# Patient Record
Sex: Female | Born: 1960 | Race: White | Hispanic: No | Smoking: Never smoker
Health system: Southern US, Community
[De-identification: ages and names within clinical notes are randomized; demographics above are authoritative.]

## PROBLEM LIST (undated history)

## (undated) DIAGNOSIS — K635 Polyp of colon: Secondary | ICD-10-CM

## (undated) DIAGNOSIS — N951 Menopausal and female climacteric states: Secondary | ICD-10-CM

## (undated) DIAGNOSIS — E785 Hyperlipidemia, unspecified: Secondary | ICD-10-CM

## (undated) DIAGNOSIS — I10 Essential (primary) hypertension: Secondary | ICD-10-CM

## (undated) DIAGNOSIS — R011 Cardiac murmur, unspecified: Secondary | ICD-10-CM

## (undated) HISTORY — DX: Essential (primary) hypertension: I10

## (undated) HISTORY — DX: Cardiac murmur, unspecified: R01.1

## (undated) HISTORY — DX: Polyp of colon: K63.5

## (undated) HISTORY — DX: Menopausal and female climacteric states: N95.1

## (undated) HISTORY — DX: Hyperlipidemia, unspecified: E78.5

---

## 2009-08-02 ENCOUNTER — Ambulatory Visit: Payer: Self-pay | Admitting: Diagnostic Radiology

## 2009-08-02 ENCOUNTER — Ambulatory Visit (HOSPITAL_BASED_OUTPATIENT_CLINIC_OR_DEPARTMENT_OTHER): Admission: RE | Admit: 2009-08-02 | Discharge: 2009-08-02 | Payer: Self-pay | Admitting: Obstetrics and Gynecology

## 2010-08-06 ENCOUNTER — Other Ambulatory Visit: Payer: Self-pay | Admitting: Obstetrics & Gynecology

## 2010-08-06 ENCOUNTER — Encounter (INDEPENDENT_AMBULATORY_CARE_PROVIDER_SITE_OTHER): Payer: PRIVATE HEALTH INSURANCE | Admitting: Obstetrics & Gynecology

## 2010-08-06 DIAGNOSIS — Z1272 Encounter for screening for malignant neoplasm of vagina: Secondary | ICD-10-CM

## 2010-08-06 DIAGNOSIS — Z01419 Encounter for gynecological examination (general) (routine) without abnormal findings: Secondary | ICD-10-CM

## 2010-09-19 NOTE — Assessment & Plan Note (Signed)
NAMECINDE, EBERT NO.:  [id number]  MEDICAL RECORD NO.:  [id number]           PATIENT TYPE:  LOCATION:  [facility] [place]           FACILITY:  PHYSICIAN:  Allie Bossier, MD             DATE OF BIRTH:  DATE OF SERVICE:  08/06/2010                                 CLINIC NOTE  Ms. Runions is a 50 year old married white nulligravida who comes in here for a new patient exam.  She moved to [place] from [place] approximately 4 years ago.  She complains of night sweats that wakes her up and vaginal dryness that causes dyspareunia in spite of [place] Jelly. She would like a refill on her hormone replacement therapy that she gets compounded.  PAST MEDICAL HISTORY:  None.  PAST SURGICAL HISTORY:  None.  SOCIAL HISTORY:  She drinks alcohol on the weekends in a social fashion. Denies tobacco or drug use.  FAMILY HISTORY:  Negative for breast, GYN, and colon malignancies.  PREVIOUS SURGERIES:  None.  REVIEW OF SYSTEMS:  She works as a Quarry manager.  She has been married for 16 years.  She is sexually active.  She has no family doctor and has not had any colonoscopy ever.  All history of Pap smears are negative.  Her last Pap smear was in November 2010.  Her last mammogram was n October 2011.  MEDICATIONS:  She takes a compounded progestin, tri-est, and testosterone 150/1.5/0.2.  She dissolves the lozenges in her mouth daily.  She also takes vitamin C, vitamin D, fish oil, magnesium, selenium.  No known drug allergies.  No latex allergies.  PHYSICAL EXAMINATION:  GENERAL:  Well-nourished, well-hydrated pleasant white female. VITAL SIGNS:  Height 5 feet 7 inches, weight 140, blood pressure 127/95, pulse 71. HEENT:  Normal. HEART:  Regular rate and rhythm. LUNGS:  Clear to auscultation bilaterally. BREASTS:  Normal bilaterally. ABDOMEN:  No palpable hepatosplenomegaly. GU:  External genitalia, moderate atrophy.  No lesions.  Cervix nulliparous.   Normal discharge.  Uterus, normal size and shape, retroverted, nontender.  Adnexa, nontender, no masses.  ASSESSMENT AND PLAN: 1. Annual exam.  Checked the Pap smear.  Recommended annual mammograms     and self-breast and self-vulvar exams monthly. 2. Slightly elevated blood pressure and no family     doctor/colonoscopy/screening.  I am referring her to the family     practice physician next door and she will have her primary care     done there as necessary.     Allie Bossier, MD    MCD/MEDQ  D:  08/06/2010  T:  08/06/2010  Job:  [id number]

## 2012-03-02 ENCOUNTER — Telehealth: Payer: Self-pay | Admitting: *Deleted

## 2012-03-02 ENCOUNTER — Ambulatory Visit (INDEPENDENT_AMBULATORY_CARE_PROVIDER_SITE_OTHER): Payer: PRIVATE HEALTH INSURANCE | Admitting: Obstetrics & Gynecology

## 2012-03-02 ENCOUNTER — Encounter: Payer: Self-pay | Admitting: Obstetrics & Gynecology

## 2012-03-02 VITALS — BP 122/88 | HR 84 | Temp 98.0°F | Resp 16 | Ht 67.0 in | Wt 141.0 lb

## 2012-03-02 DIAGNOSIS — Z0000 Encounter for general adult medical examination without abnormal findings: Secondary | ICD-10-CM

## 2012-03-02 DIAGNOSIS — Z124 Encounter for screening for malignant neoplasm of cervix: Secondary | ICD-10-CM

## 2012-03-02 NOTE — Telephone Encounter (Signed)
[facility] pharmacy does not do the HRT and I sent RX to [facility]  [phone] in [place], [place]

## 2012-03-02 NOTE — Progress Notes (Signed)
RX for her compounded HRT was called into [facility].

## 2012-03-02 NOTE — Progress Notes (Signed)
Subjective:    Tanya Hernandez is a 51 y.o. female who presents for an annual exam. The patient has no complaints today. She wants to refill her compounded HRT. The patient is sexually active. GYN screening history: last pap: was normal. The patient wears seatbelts: yes. The patient participates in regular exercise: yes (Zumba). Has the patient ever been transfused or tattooed?: no. The patient reports that there is not domestic violence in her life.   Menstrual History: OB History    Grav Para Term Preterm Abortions TAB SAB Ect Mult Living   0 0 0 0 0 0 0 0 0 0       Menarche age: 11 No LMP recorded. Patient is postmenopausal.    The following portions of the patient's history were reviewed and updated as appropriate: allergies, current medications, past family history, past medical history, past social history, past surgical history and problem list.  Review of Systems A comprehensive review of systems was negative. 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, Quarry manager  Objective:    BP 122/88  Pulse 84  Temp 98 F (36.7 C) (Oral)  Resp 16  Ht 5\' 7"  (1.702 m)  Wt 141 lb (63.957 kg)  BMI 22.08 kg/m2  General Appearance:    Alert, cooperative, no distress, appears stated age  Head:    Normocephalic, without obvious abnormality, atraumatic  Eyes:    PERRL, conjunctiva/corneas clear, EOM's intact, fundi    benign, both eyes  Ears:    Normal TM's and external ear canals, both ears  Nose:   Nares normal, septum midline, mucosa normal, no drainage    or sinus tenderness  Throat:   Lips, mucosa, and tongue normal; teeth and gums normal  Neck:   Supple, symmetrical, trachea midline, no adenopathy;    thyroid:  no enlargement/tenderness/nodules; no carotid   bruit or JVD  Back:     Symmetric, no curvature, ROM normal, no CVA tenderness  Lungs:     Clear to auscultation bilaterally, respirations unlabored  Chest Wall:    No tenderness or deformity   Heart:    Regular  rate and rhythm, S1 and S2 normal, no murmur, rub   or gallop  Breast Exam:    No tenderness, masses, or nipple abnormality  Abdomen:     Soft, non-tender, bowel sounds active all four quadrants,    no masses, no organomegaly  Genitalia:    Normal female without lesion, discharge or tenderness, mild atrophy, NSSR, NT, minimal mobility, no adnexal masses or tenderness     Extremities:   Extremities normal, atraumatic, no cyanosis or edema  Pulses:   2+ and symmetric all extremities  Skin:   Skin color, texture, turgor normal, no rashes or lesions  Lymph nodes:   Cervical, supraclavicular, and axillary nodes normal  Neurologic:   CNII-XII intact, normal strength, sensation and reflexes    throughout  .    Assessment:    Healthy female exam.    Plan:     Mammogram. Pap smear.  Refill on HRT I have recommended a colonoscopy

## 2012-03-03 ENCOUNTER — Ambulatory Visit (HOSPITAL_BASED_OUTPATIENT_CLINIC_OR_DEPARTMENT_OTHER)
Admission: RE | Admit: 2012-03-03 | Discharge: 2012-03-03 | Disposition: A | Discharge status: Home / Self Care | Payer: PRIVATE HEALTH INSURANCE | Source: Ambulatory Visit | Attending: Obstetrics & Gynecology | Admitting: Obstetrics & Gynecology

## 2012-03-03 DIAGNOSIS — Z0000 Encounter for general adult medical examination without abnormal findings: Secondary | ICD-10-CM

## 2012-03-03 DIAGNOSIS — Z1231 Encounter for screening mammogram for malignant neoplasm of breast: Secondary | ICD-10-CM | POA: Insufficient documentation

## 2012-04-20 ENCOUNTER — Telehealth: Payer: Self-pay | Admitting: *Deleted

## 2012-04-20 DIAGNOSIS — Z79890 Hormone replacement therapy (postmenopausal): Secondary | ICD-10-CM

## 2012-04-20 MED ORDER — AMBULATORY NON FORMULARY MEDICATION: 1.0000 | Freq: Every day | Status: DC

## 2012-04-20 NOTE — Telephone Encounter (Signed)
Pt called wanted her HRT switched to [employer] as she did not like Environmental education officer.

## 2013-10-18 ENCOUNTER — Encounter: Payer: Self-pay | Admitting: Obstetrics & Gynecology

## 2013-10-18 ENCOUNTER — Ambulatory Visit (INDEPENDENT_AMBULATORY_CARE_PROVIDER_SITE_OTHER): Payer: PRIVATE HEALTH INSURANCE | Admitting: Obstetrics & Gynecology

## 2013-10-18 VITALS — BP 155/96 | HR 63 | Resp 16 | Ht 67.0 in | Wt 138.0 lb

## 2013-10-18 DIAGNOSIS — Z1151 Encounter for screening for human papillomavirus (HPV): Secondary | ICD-10-CM

## 2013-10-18 DIAGNOSIS — Z0000 Encounter for general adult medical examination without abnormal findings: Secondary | ICD-10-CM

## 2013-10-18 DIAGNOSIS — Z124 Encounter for screening for malignant neoplasm of cervix: Secondary | ICD-10-CM

## 2013-10-18 DIAGNOSIS — Z01419 Encounter for gynecological examination (general) (routine) without abnormal findings: Secondary | ICD-10-CM

## 2013-10-18 NOTE — Progress Notes (Signed)
Subjective:    Tanya Hernandez is a 53 y.o. female who presents for an annual exam. The patient has no complaints today. She wants a refill of her HRT (est/test/prog) The patient is sexually active. GYN screening history: last pap: was normal. The patient wears seatbelts: yes. The patient participates in regular exercise: yes.( zumba) Has the patient ever been transfused or tattooed?: no. The patient reports that there is not domestic violence in her life.   Menstrual History: OB History   Grav Para Term Preterm Abortions TAB SAB Ect Mult Living   0 0 0 0 0 0 0 0 0 0       Menarche age: 61  No LMP recorded. Patient is postmenopausal.    The following portions of the patient's history were reviewed and updated as appropriate: allergies, current medications, past family history, past medical history, past social history, past surgical history and problem list.  Review of Systems A comprehensive review of systems was negative. Her FP does her screening labs. She needs a mammo. Colonoscopy is due. Married for 19 years, some dryness with sex   Objective:    BP 155/96  Pulse 63  Resp 16  Ht 5\' 7"  (1.702 m)  Wt 138 lb (62.596 kg)  BMI 21.61 kg/m2  General Appearance:    Alert, cooperative, no distress, appears stated age  Head:    Normocephalic, without obvious abnormality, atraumatic  Eyes:    PERRL, conjunctiva/corneas clear, EOM's intact, fundi    benign, both eyes  Ears:    Normal TM's and external ear canals, both ears  Nose:   Nares normal, septum midline, mucosa normal, no drainage    or sinus tenderness  Throat:   Lips, mucosa, and tongue normal; teeth and gums normal  Neck:   Supple, symmetrical, trachea midline, no adenopathy;    thyroid:  no enlargement/tenderness/nodules; no carotid   bruit or JVD  Back:     Symmetric, no curvature, ROM normal, no CVA tenderness  Lungs:     Clear to auscultation bilaterally, respirations unlabored  Chest Wall:    No tenderness or deformity   Heart:    Regular rate and rhythm, S1 and S2 normal, no murmur, rub   or gallop  Breast Exam:    No tenderness, masses, or nipple abnormality  Abdomen:     Soft, non-tender, bowel sounds active all four quadrants,    no masses, no organomegaly  Genitalia:    Normal female without lesion, discharge or tenderness, NSSR, NT, mobile, normal adnexal exam     Extremities:   Extremities normal, atraumatic, no cyanosis or edema  Pulses:   2+ and symmetric all extremities  Skin:   Skin color, texture, turgor normal, no rashes or lesions  Lymph nodes:   Cervical, supraclavicular, and axillary nodes normal  Neurologic:   CNII-XII intact, normal strength, sensation and reflexes    throughout  .    Assessment:    Healthy female exam.    Plan:     Breast self exam technique reviewed and patient encouraged to perform self-exam monthly. Mammogram. Thin prep Pap smear. with cotesting Colonoscopy recommended I refilled her compounded HRT

## 2013-11-21 ENCOUNTER — Telehealth: Payer: Self-pay | Admitting: *Deleted

## 2013-11-21 NOTE — Telephone Encounter (Signed)
LM on voicemail of no cancerous cells noted on pap smear done 10/18/13.

## 2013-11-27 ENCOUNTER — Other Ambulatory Visit: Payer: Self-pay | Admitting: Obstetrics & Gynecology

## 2013-11-27 DIAGNOSIS — Z1231 Encounter for screening mammogram for malignant neoplasm of breast: Secondary | ICD-10-CM

## 2013-12-06 ENCOUNTER — Ambulatory Visit (HOSPITAL_BASED_OUTPATIENT_CLINIC_OR_DEPARTMENT_OTHER)
Admission: RE | Admit: 2013-12-06 | Discharge: 2013-12-06 | Disposition: A | Discharge status: Home / Self Care | Payer: PRIVATE HEALTH INSURANCE | Source: Ambulatory Visit | Attending: Obstetrics & Gynecology | Admitting: Obstetrics & Gynecology

## 2013-12-06 DIAGNOSIS — Z1231 Encounter for screening mammogram for malignant neoplasm of breast: Secondary | ICD-10-CM | POA: Insufficient documentation

## 2014-05-21 ENCOUNTER — Other Ambulatory Visit: Payer: Self-pay | Admitting: *Deleted

## 2014-05-21 DIAGNOSIS — Z79890 Hormone replacement therapy (postmenopausal): Secondary | ICD-10-CM

## 2014-05-21 MED ORDER — AMBULATORY NON FORMULARY MEDICATION: 1.0000 | Freq: Every day | Status: AC

## 2014-05-21 NOTE — Telephone Encounter (Signed)
RF authorization for estrogen/progest lonzenges sent to [facility].

## 2016-11-03 DIAGNOSIS — N951 Menopausal and female climacteric states: Secondary | ICD-10-CM | POA: Diagnosis not present

## 2016-11-03 DIAGNOSIS — Z0000 Encounter for general adult medical examination without abnormal findings: Secondary | ICD-10-CM | POA: Diagnosis not present

## 2016-11-17 ENCOUNTER — Other Ambulatory Visit (HOSPITAL_BASED_OUTPATIENT_CLINIC_OR_DEPARTMENT_OTHER): Payer: Self-pay | Admitting: Internal Medicine

## 2016-11-17 DIAGNOSIS — Z1231 Encounter for screening mammogram for malignant neoplasm of breast: Secondary | ICD-10-CM

## 2016-11-19 ENCOUNTER — Ambulatory Visit (HOSPITAL_BASED_OUTPATIENT_CLINIC_OR_DEPARTMENT_OTHER)
Admission: RE | Admit: 2016-11-19 | Discharge: 2016-11-19 | Disposition: A | Discharge status: Home / Self Care | Payer: Commercial Managed Care - PPO | Source: Ambulatory Visit | Attending: Internal Medicine | Admitting: Internal Medicine

## 2016-11-19 DIAGNOSIS — Z1231 Encounter for screening mammogram for malignant neoplasm of breast: Secondary | ICD-10-CM | POA: Insufficient documentation

## 2017-02-12 DIAGNOSIS — Z1211 Encounter for screening for malignant neoplasm of colon: Secondary | ICD-10-CM | POA: Diagnosis not present

## 2017-02-12 DIAGNOSIS — D126 Benign neoplasm of colon, unspecified: Secondary | ICD-10-CM | POA: Diagnosis not present

## 2017-11-09 DIAGNOSIS — M79646 Pain in unspecified finger(s): Secondary | ICD-10-CM | POA: Diagnosis not present

## 2017-11-09 DIAGNOSIS — E785 Hyperlipidemia, unspecified: Secondary | ICD-10-CM | POA: Diagnosis not present

## 2017-11-09 DIAGNOSIS — Z0000 Encounter for general adult medical examination without abnormal findings: Secondary | ICD-10-CM | POA: Diagnosis not present

## 2017-11-09 DIAGNOSIS — I10 Essential (primary) hypertension: Secondary | ICD-10-CM | POA: Diagnosis not present

## 2020-09-25 ENCOUNTER — Other Ambulatory Visit (HOSPITAL_BASED_OUTPATIENT_CLINIC_OR_DEPARTMENT_OTHER): Payer: Self-pay | Admitting: Physician Assistant

## 2020-09-25 DIAGNOSIS — Z1231 Encounter for screening mammogram for malignant neoplasm of breast: Secondary | ICD-10-CM

## 2020-09-27 ENCOUNTER — Ambulatory Visit (HOSPITAL_BASED_OUTPATIENT_CLINIC_OR_DEPARTMENT_OTHER)
Admission: RE | Admit: 2020-09-27 | Discharge: 2020-09-27 | Disposition: A | Discharge status: Home / Self Care | Payer: Managed Care, Other (non HMO) | Source: Ambulatory Visit | Attending: Physician Assistant | Admitting: Physician Assistant

## 2020-09-27 ENCOUNTER — Other Ambulatory Visit: Payer: Self-pay

## 2020-09-27 DIAGNOSIS — Z1231 Encounter for screening mammogram for malignant neoplasm of breast: Secondary | ICD-10-CM

## 2022-02-19 ENCOUNTER — Other Ambulatory Visit: Payer: Self-pay | Admitting: Family Medicine

## 2022-02-19 ENCOUNTER — Ambulatory Visit
Admission: RE | Admit: 2022-02-19 | Discharge: 2022-02-19 | Disposition: A | Discharge status: Home / Self Care | Payer: Commercial Managed Care - HMO | Source: Ambulatory Visit | Attending: Family Medicine | Admitting: Family Medicine

## 2022-02-19 DIAGNOSIS — M20011 Mallet finger of right finger(s): Secondary | ICD-10-CM

## 2022-02-20 ENCOUNTER — Other Ambulatory Visit: Payer: Self-pay | Admitting: Family Medicine

## 2022-02-20 DIAGNOSIS — Z780 Asymptomatic menopausal state: Secondary | ICD-10-CM

## 2022-02-20 DIAGNOSIS — G8929 Other chronic pain: Secondary | ICD-10-CM

## 2022-02-24 ENCOUNTER — Encounter: Payer: Self-pay | Admitting: Rehabilitative and Restorative Service Providers"

## 2022-02-24 ENCOUNTER — Ambulatory Visit: Payer: PRIVATE HEALTH INSURANCE | Admitting: Orthopaedic Surgery

## 2022-02-24 ENCOUNTER — Other Ambulatory Visit: Payer: Self-pay

## 2022-02-24 ENCOUNTER — Ambulatory Visit: Payer: Commercial Managed Care - HMO | Admitting: Rehabilitative and Restorative Service Providers"

## 2022-02-24 DIAGNOSIS — M25541 Pain in joints of right hand: Secondary | ICD-10-CM

## 2022-02-24 DIAGNOSIS — R600 Localized edema: Secondary | ICD-10-CM

## 2022-02-24 DIAGNOSIS — M6281 Muscle weakness (generalized): Secondary | ICD-10-CM | POA: Diagnosis not present

## 2022-02-24 DIAGNOSIS — R278 Other lack of coordination: Secondary | ICD-10-CM | POA: Diagnosis not present

## 2022-02-24 DIAGNOSIS — M25641 Stiffness of right hand, not elsewhere classified: Secondary | ICD-10-CM

## 2022-02-24 NOTE — Therapy (Signed)
OUTPATIENT OCCUPATIONAL THERAPY ORTHO EVALUATION  Patient Name: Tanya Hernandez MRN: [id number] DOB:06/08/1961, 61 y.o., female Today's Date: 02/24/2022  PCP: Corine Shelter, PA-C REFERRING PROVIDER: Eunice Blase, MD   OT End of Session - 02/24/22 1114     Visit Number 1    Number of Visits 6    Date for OT Re-Evaluation 05/08/22    OT Start Time 1114    OT Stop Time 1224    OT Time Calculation (min) 70 min    Equipment Utilized During Treatment orthotic materials    Activity Tolerance No increased pain;Patient tolerated treatment well;Patient limited by pain    Behavior During Therapy [facility] for tasks assessed/performed             Past Medical History:  Diagnosis Date   Menopausal state    History reviewed. No pertinent surgical history. There are no problems to display for this patient.   ONSET DATE: 02/19/22 DOI Approx   REFERRING DIAG: Right 5th finger with boney mallet injury and slight displacement  THERAPY DIAG:  Pain in joint of right hand - Plan: Ot plan of care cert/re-cert  Other lack of coordination - Plan: Ot plan of care cert/re-cert  Localized edema - Plan: Ot plan of care cert/re-cert  Muscle weakness (generalized) - Plan: Ot plan of care cert/re-cert  Stiffness of right hand, not elsewhere classified - Plan: Ot plan of care cert/re-cert  Rationale for Evaluation and Treatment Rehabilitation  SUBJECTIVE:   SUBJECTIVE STATEMENT: She states she fell while walking her dog, injured the tip of her right hand small finger. She states having pain, stiffness, and not understanding precautions. She types a lot and needs full use of her hands, right now is not safe to do all I/ADLs. She arrives wearing a piece of tape around her small finger. She shows OT the small splint the doctor gave her, but she states taking it off last night in favor of the tape. OT educates her to not allow tip of finger to bend for at least 6 weeks.   PERTINENT HISTORY: starting  immobilization protocol week 1 now (02/24/22)   WEIGHT BEARING RESTRICTIONS Yes avoid a strong grip or force through right hand for next 4-6 weeks.   PAIN:  Are you having pain? Yes Rating: 1-2/10 mildly sore in right SF DIP J now  FALLS: Has patient fallen in last 6 months? Yes. Number of falls 1 (this injury)  LIVING ENVIRONMENT: Lives with: lives with their spouse   PLOF: Independent  PATIENT GOALS To get full use of right dominant hand back, safely.   OBJECTIVE:   HAND DOMINANCE: Right   ADLs: Overall ADLs: States decreased ability to grab, hold household objects, pain and inability to open containers, perform all FMS tasks, etc.    FUNCTIONAL OUTCOME MEASURES: Eval: Patient Specific Functional Scale: 8 (lifting gallon of milk, typing, self-care exercise routines)  UPPER EXTREMITY ROM    Eval: right SF passively flexible to full extension or slight (~5*) hyperextension today, but sags/drops passively from ligament/bony injury.  Strength and motion not appropriate for this hand/finger yet.   Interesting to note that PIP J is somewhat stiff in extension and passively tender past ~60* flexion. Wrist and proximal seem within normal limits.     Active ROM Right TBD  Little MCP (0-90)    Little PIP (0-100)    Little DIP (0-70)    (Blank rows = not tested)   UPPER EXTREMITY MMT:    Eval: not appropriate  for right hand now, but overtly weaker due to injury, pain  HAND FUNCTION: Eval: Grip strength Right: TBD lbs  COORDINATION: Eval: mild difficulties with FMS due to SF injury now  SENSATION: Eval:  Light touch intact today  EDEMA:   Eval:  Mildly swollen in small finger DIP today  OBSERVATIONS:   Eval: Apparent mallet injury to right SF. Slightly red and swollen, tender.    TODAY'S TREATMENT:  Eval:  Custom orthotic fabrication was indicated due to pt's healing bone and tendon injury to right hand and need for safe, functional positioning. OT fabricated three  custom mallet orthotics for the purpose of having stabilized DIP J in full extension while healing & to have the ability to change out the orthotic if one becomes wet from shower, needs cleaned, etc. They fit well with no areas of pressure, pt states a fairly comfortable fit. Pt was educated on putting on and off orthotics carefully with fingertip against table top to prevent any flexing of DIP joint. She did several trials of this for functional safety training. She was also edu on wearing at all times, not allowing tip of finger to bend for at least 6 weeks straight.  She states understanding.   She should call or come in ASAP if it is causing any irritation or is not achieving desired function. It will be checked/adjusted in upcoming sessions, as needed. Pt states understanding.   OT also suggests doing light blocks AROM for stiff PIP J as well as open/close hand 3-4 times a day 10-15 x to light tension (no pain), and to continue using hand for all light daily activities as tolerated.     PATIENT EDUCATION: Education details: See tx section above for details  Person educated: Patient Education method: Verbal Instruction, Teach back, Handouts  Education comprehension: States and demonstrates understanding, Additional Education required    HOME EXERCISE PROGRAM: See tx section above for details   GOALS: Goals reviewed with patient? Yes   SHORT TERM GOALS: (STG required if POC>30 days)  Pt will obtain protective, custom orthotic. Target date: 02/24/22 Goal status: MET  2.  Pt will demo/state understanding of initial HEP to improve pain levels and prerequisite motion. Target date: 02/24/22 Goal status: MET   LONG TERM GOALS:  Pt will improve functional ability by decreased impairment per PSFS assessment from 8 to 9.5 or better, for better quality of life. Target date: 05/08/22 Goal status: INITIAL  2.  Pt will improve grip strength in right hand to at least 40 lbs for functional  use at home and in IADLs. Target date: 05/08/22 Goal status: INITIAL  3.  Pt will improve A/ROM in right SF TAM to at least 220*, to have functional motion for tasks like reach and grasp.  Target date: 05/08/22 Goal status: INITIAL  4.  Pt will improve strength in right arm to at least 5/5 MMT to have increased functional ability to carry out selfcare and higher-level homecare tasks with no difficulty. Target date: 05/08/22 Goal status: INITIAL   ASSESSMENT:  CLINICAL IMPRESSION: Patient is a 61 y.o. female who was seen today for occupational therapy evaluation for right hand mallet finger injury, subsequent difficulties with pain, motion and ADLs. She will benefit from OP OT.   PERFORMANCE DEFICITS in functional skills including IADLs, coordination, dexterity, edema, ROM, strength, pain, flexibility, body mechanics, endurance, and UE functional use, cognitive skills including safety awareness, and psychosocial skills including coping strategies and routines and behaviors.   IMPAIRMENTS are  limiting patient from IADLs, work, and leisure.   COMORBIDITIES may have co-morbidities  that affects occupational performance. Patient will benefit from skilled OT to address above impairments and improve overall function.  MODIFICATION OR ASSISTANCE TO COMPLETE EVALUATION: No modification of tasks or assist necessary to complete an evaluation.  OT OCCUPATIONAL PROFILE AND HISTORY: Problem focused assessment: Including review of records relating to presenting problem.  CLINICAL DECISION MAKING: LOW - limited treatment options, no task modification necessary  REHAB POTENTIAL: Excellent  EVALUATION COMPLEXITY: Low    PLAN: OT FREQUENCY:  During initial 6 weeks, she may be seen for f/u to adjust orthotics or address issues as needed, otherwise, she can return after immobilization 1-2 visits a week for 4 additional weeks as needed to work on hand motion and strength safely.   OT DURATION: 10  weeks (total time, through 05/08/22 as needed)   PLANNED INTERVENTIONS: self care/ADL training, therapeutic exercise, therapeutic activity, manual therapy, passive range of motion, splinting, ultrasound, fluidotherapy, moist heat, cryotherapy, contrast bath, patient/family education, coping strategies training, and Re-evaluation  RECOMMENDED OTHER SERVICES: none now   CONSULTED AND AGREED WITH PLAN OF CARE: Patient  PLAN FOR NEXT SESSION: check orthotics as needed in next 6 weeks, otherwise she can return for ROM and strength rehab, as needed, once considered well healed by MD.   Benito Mccreedy, OTR/L, CHT 02/24/2022, 4:43 PM

## 2022-03-24 ENCOUNTER — Other Ambulatory Visit: Payer: Self-pay | Admitting: Family Medicine

## 2022-03-24 ENCOUNTER — Ambulatory Visit
Admission: RE | Admit: 2022-03-24 | Discharge: 2022-03-24 | Disposition: A | Discharge status: Home / Self Care | Payer: No Typology Code available for payment source | Source: Ambulatory Visit | Attending: Family Medicine | Admitting: Family Medicine

## 2022-03-24 DIAGNOSIS — M20011 Mallet finger of right finger(s): Secondary | ICD-10-CM

## 2022-04-02 ENCOUNTER — Other Ambulatory Visit: Payer: PRIVATE HEALTH INSURANCE

## 2022-04-13 ENCOUNTER — Encounter: Payer: Self-pay | Admitting: Rehabilitative and Restorative Service Providers"

## 2022-04-13 ENCOUNTER — Ambulatory Visit: Payer: PRIVATE HEALTH INSURANCE | Admitting: Rehabilitative and Restorative Service Providers"

## 2022-04-13 DIAGNOSIS — R600 Localized edema: Secondary | ICD-10-CM | POA: Diagnosis not present

## 2022-04-13 DIAGNOSIS — M6281 Muscle weakness (generalized): Secondary | ICD-10-CM | POA: Diagnosis not present

## 2022-04-13 DIAGNOSIS — M25641 Stiffness of right hand, not elsewhere classified: Secondary | ICD-10-CM

## 2022-04-13 DIAGNOSIS — R278 Other lack of coordination: Secondary | ICD-10-CM | POA: Diagnosis not present

## 2022-04-13 DIAGNOSIS — M25541 Pain in joints of right hand: Secondary | ICD-10-CM

## 2022-04-13 NOTE — Therapy (Addendum)
OUTPATIENT OCCUPATIONAL THERAPY DISCHARGE NOTE  Patient Name: Tanya Hernandez MRN: [id number] DOB:08/22/1960, 61 y.o., female Today's Date: 04/13/2022  PCP: Corine Shelter, PA-C REFERRING PROVIDER: Eunice Blase, MD   OT End of Session - 04/13/22 0846     Visit Number 2    Number of Visits 6    Date for OT Re-Evaluation 05/08/22    OT Start Time 0846    OT Stop Time 0923    OT Time Calculation (min) 37 min    Equipment Utilized During Treatment orthotic materials    Activity Tolerance No increased pain;Patient tolerated treatment well;Patient limited by pain    Behavior During Therapy [facility] for tasks assessed/performed              Past Medical History:  Diagnosis Date   Menopausal state    History reviewed. No pertinent surgical history. There are no problems to display for this patient.   ONSET DATE: 02/19/22 DOI Approx   REFERRING DIAG: Right 5th finger with boney mallet injury and slight displacement  THERAPY DIAG:  Other lack of coordination  Localized edema  Muscle weakness (generalized)  Stiffness of right hand, not elsewhere classified  Pain in joint of right hand  Rationale for Evaluation and Treatment Rehabilitation  PERTINENT HISTORY: She is now ~7 weeks into immobilization period after Rt SF bony mallet finger injury. She started immobilization 02/24/22  WEIGHT BEARING RESTRICTIONS Yes, slowly work through AROM, blocking, and into light gripping over the next 3-5 weeks.    SUBJECTIVE:   SUBJECTIVE STATEMENT: She returns after jut under 6 weeks from initial eval and orthotic fabrication. She states that her recent MD f/u and imaging revealed slow to heal bony mallet injury and fragment still somewhat displaced. She has no pain now, feels stiff. She came with new order which will be ignored, as she is still currently under a plan of care for this. She states she has been careful to not bend DIP joint, and orthotics have felt a little tight on her due to  swelling changes. She states she don't know when her next MD f/u is or how much longer he'd like her immobilized.   PAIN:  Are you having pain? No Rating: 0/10  in right SF DIP J now   OBJECTIVE: (All objective assessments below are from initial evaluation on: 02/24/22 unless otherwise specified.)    HAND DOMINANCE: Right   ADLs: Overall ADLs: States decreased ability to grab, hold household objects, pain and inability to open containers, perform all FMS tasks, etc.    FUNCTIONAL OUTCOME MEASURES: Eval: Patient Specific Functional Scale: 8 (lifting gallon of milk, typing, self-care exercise routines)  UPPER EXTREMITY ROM    Eval: right SF passively flexible to full extension or slight (~5*) hyperextension today, but sags/drops passively from ligament/bony injury.  Strength and motion not appropriate for this hand/finger yet.   Interesting to note that PIP J is somewhat stiff in extension and passively tender past ~60* flexion. Wrist and proximal seem within normal limits.     Active ROM Right TBD  Little MCP (0-90)    Little PIP (0-100)    Little DIP (0-70)   (Blank rows = not tested)   UPPER EXTREMITY MMT:    Eval: not appropriate for right hand now, but overtly weaker due to injury, pain  HAND FUNCTION: Eval: Grip strength Right: TBD lbs  COORDINATION: Eval: mild difficulties with FMS due to SF injury now   OBSERVATIONS:   04/13/22: She seems able to  maintain DIP J extension with AROM now, but is a bit drooping still (7-10* lag). Not as red, but still somewhat swollen today, still mildly tender about DIP J.     TODAY'S TREATMENT:  04/13/22:  OT has her remove orthotic and she is able to keep SF DIP J mostly straight actively (approx (-7*) lag). This used to be much greater ~60-70* bend, per pt. She has no significant pain with attempted AROM and just appears stiff, however due to MD's new order and lack of full healing on x-ray imaging, we will keep her immobilized in  new, better fitting orthotics (2 new made today to accommodate changes in swelling and to have better force in extension) for an additional 2-4 weeks, likely. (This will depend on healing, etc.)  She doesn't currently have a f/u scheduled with her MD, so OT called his office and left a message with the  to ask about the expectation for f/u and additional immobilization time.  OT will await a response and let the pt know.  She can continue to self-manage with orthotic immobilization until otherwise informed.    PATIENT EDUCATION: Education details: See tx section above for details  Person educated: Patient Education method: Verbal Instruction, Teach back, Handouts  Education comprehension: States and demonstrates understanding, Additional Education required    HOME EXERCISE PROGRAM: See tx section above for details   GOALS: Goals reviewed with patient? Yes   SHORT TERM GOALS: (STG required if POC>30 days)  Pt will obtain protective, custom orthotic. Target date: 02/24/22 Goal status: MET  2.  Pt will demo/state understanding of initial HEP to improve pain levels and prerequisite motion. Target date: 02/24/22 Goal status: MET   LONG TERM GOALS:  Pt will improve functional ability by decreased impairment per PSFS assessment from 8 to 9.5 or better, for better quality of life. Target date: 05/08/22 Goal status: INITIAL  2.  Pt will improve grip strength in right hand to at least 40 lbs for functional use at home and in IADLs. Target date: 05/08/22 Goal status: INITIAL  3.  Pt will improve A/ROM in right SF TAM to at least 220*, to have functional motion for tasks like reach and grasp.  Target date: 05/08/22 Goal status: INITIAL  4.  Pt will improve strength in right arm to at least 5/5 MMT to have increased functional ability to carry out selfcare and higher-level homecare tasks with no difficulty. Target date: 05/08/22 Goal status: INITIAL   ASSESSMENT:  CLINICAL  IMPRESSION: 04/13/22: She will likely soon be able to start repetitive HEP, but OT will use caution and wait to hear from her MD.    PLAN: OT FREQUENCY:  During initial 6 weeks, she may be seen for f/u to adjust orthotics or address issues as needed, otherwise, she can return after immobilization 1-2 visits a week for 4 additional weeks as needed to work on hand motion and strength safely.   OT DURATION: 10 weeks (total time, through 05/08/22 as needed)   PLANNED INTERVENTIONS: self care/ADL training, therapeutic exercise, therapeutic activity, manual therapy, passive range of motion, splinting, ultrasound, fluidotherapy, moist heat, cryotherapy, contrast bath, patient/family education, coping strategies training, and Re-evaluation  RECOMMENDED OTHER SERVICES: none now   CONSULTED AND AGREED WITH PLAN OF CARE: Patient  PLAN FOR NEXT SESSION:  Check new HEP, start formal HEP.   Benito Mccreedy, OTR/L, CHT 04/13/2022, 3:09 PM   OCCUPATIONAL THERAPY DISCHARGE SUMMARY  She did not return to therapy or contact us for  months, so OT will discharge her now per policy.  Please see notes for details, goals could not be addressed.   Benito Mccreedy, OTR/L, CHT 06/10/22

## 2022-04-21 ENCOUNTER — Ambulatory Visit
Admission: RE | Admit: 2022-04-21 | Discharge: 2022-04-21 | Disposition: A | Discharge status: Home / Self Care | Payer: Commercial Managed Care - HMO | Source: Ambulatory Visit | Attending: Family Medicine | Admitting: Family Medicine

## 2022-04-21 ENCOUNTER — Other Ambulatory Visit: Payer: Self-pay | Admitting: Family Medicine

## 2022-04-21 DIAGNOSIS — T1490XA Injury, unspecified, initial encounter: Secondary | ICD-10-CM

## 2022-06-18 ENCOUNTER — Other Ambulatory Visit (HOSPITAL_BASED_OUTPATIENT_CLINIC_OR_DEPARTMENT_OTHER): Payer: Self-pay | Admitting: Family Medicine

## 2022-06-18 DIAGNOSIS — Z1231 Encounter for screening mammogram for malignant neoplasm of breast: Secondary | ICD-10-CM

## 2022-07-06 ENCOUNTER — Encounter (HOSPITAL_BASED_OUTPATIENT_CLINIC_OR_DEPARTMENT_OTHER): Payer: Self-pay

## 2022-07-06 ENCOUNTER — Ambulatory Visit (HOSPITAL_BASED_OUTPATIENT_CLINIC_OR_DEPARTMENT_OTHER)
Admission: RE | Admit: 2022-07-06 | Discharge: 2022-07-06 | Disposition: A | Discharge status: Home / Self Care | Payer: Commercial Managed Care - HMO | Source: Ambulatory Visit | Attending: Family Medicine | Admitting: Family Medicine

## 2022-07-06 DIAGNOSIS — Z1231 Encounter for screening mammogram for malignant neoplasm of breast: Secondary | ICD-10-CM | POA: Insufficient documentation

## 2022-07-10 IMAGING — MG MM DIGITAL SCREENING BILAT W/ TOMO AND CAD
8 series · 9 of 24 positions shown · non-contrast
Comparison: Previous exam(s).

CLINICAL DATA: Screening.

EXAM:
DIGITAL SCREENING BILATERAL MAMMOGRAM WITH TOMOSYNTHESIS AND CAD
TECHNIQUE: Bilateral screening digital craniocaudal and mediolateral oblique
mammograms were obtained. Bilateral screening digital breast
tomosynthesis was performed. The images were evaluated with
computer-aided detection.

[R CC synth-2D]
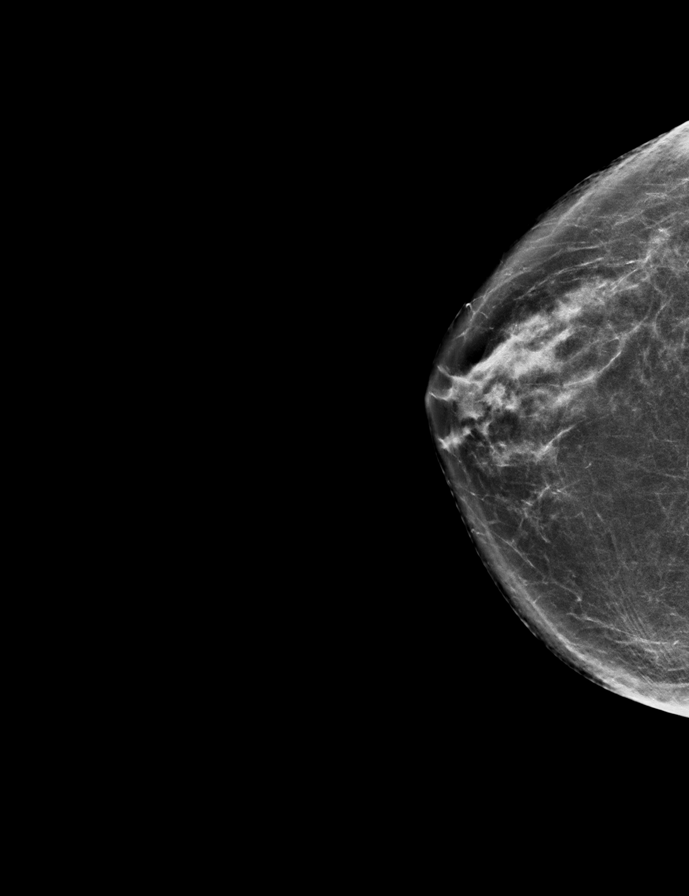

[R MLO synth-2D]
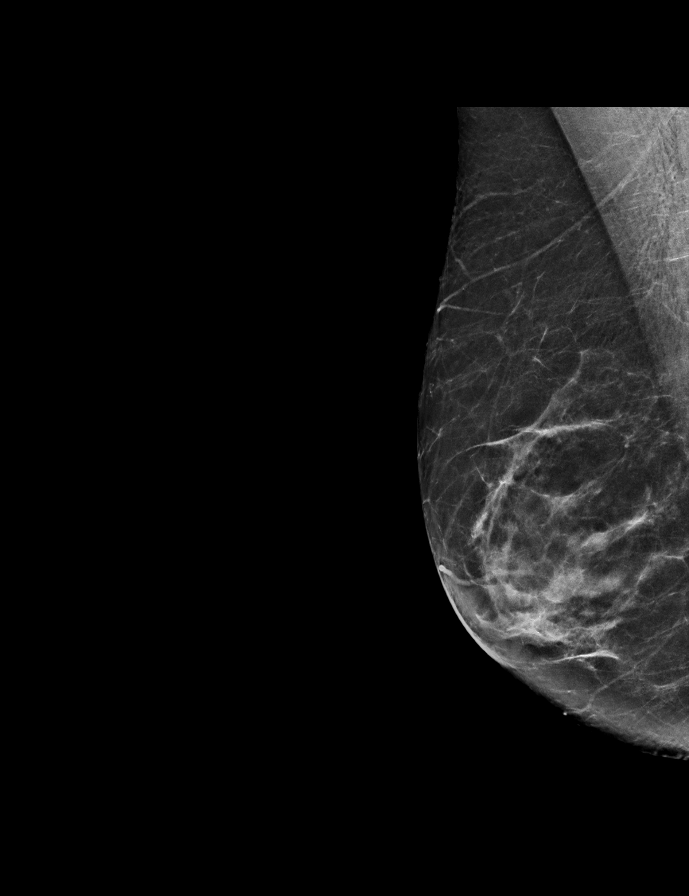

[L MLO synth-2D]
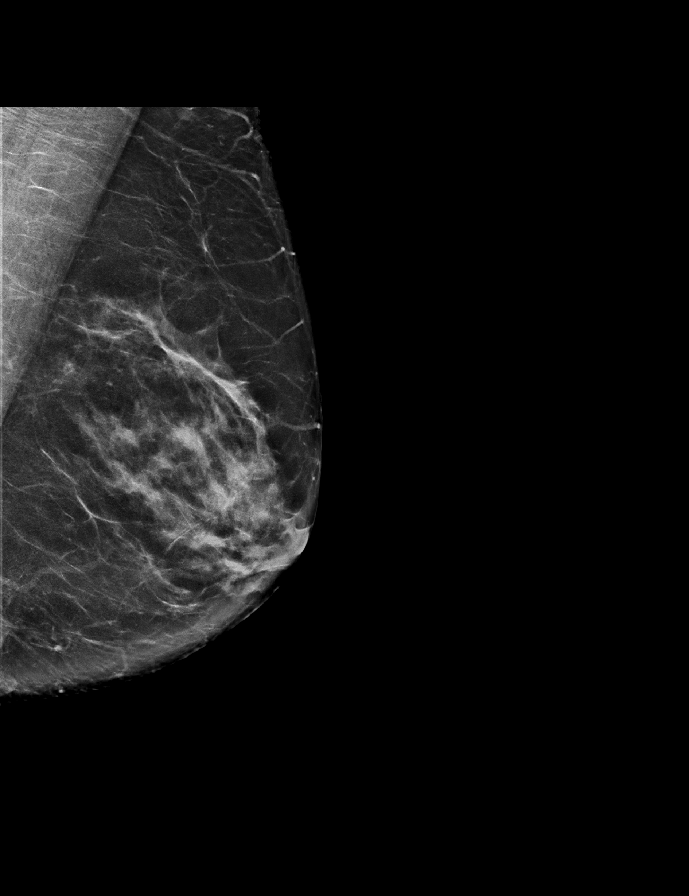

[L CC synth-2D]
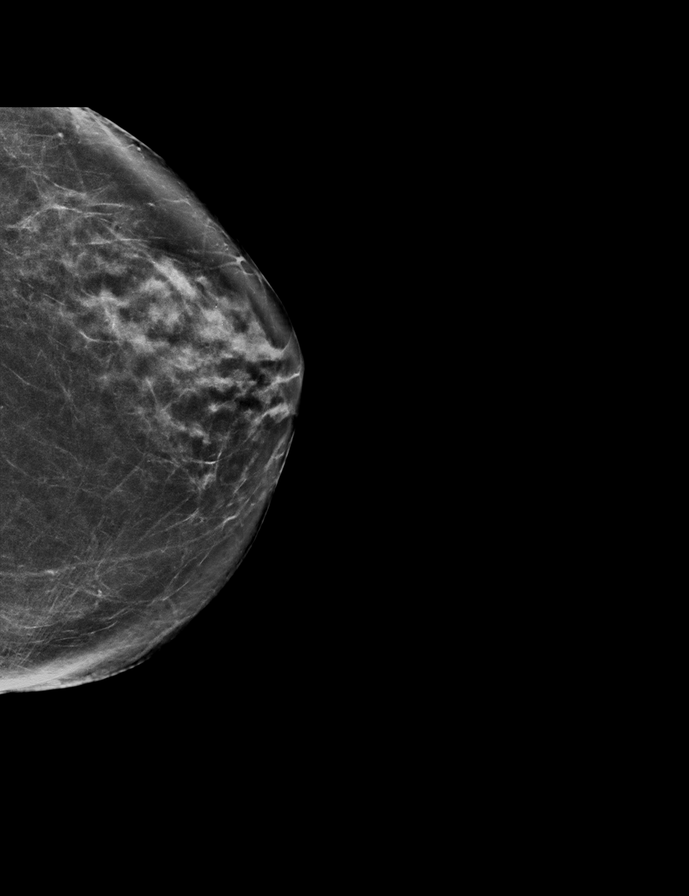

[L MLO tomo · 2 of 70 frames shown]
[frame 23/70]
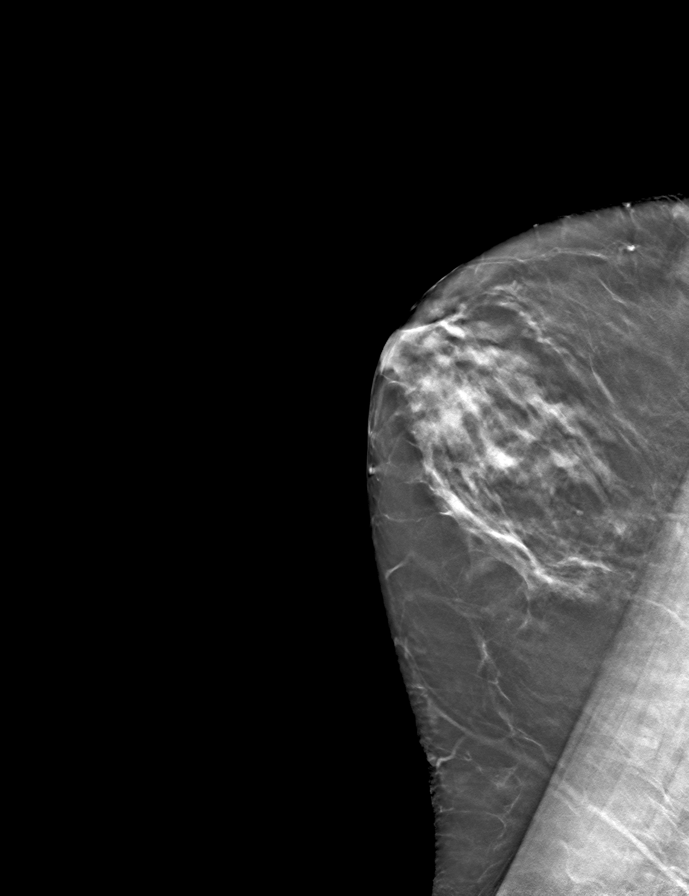
[frame 35/70]
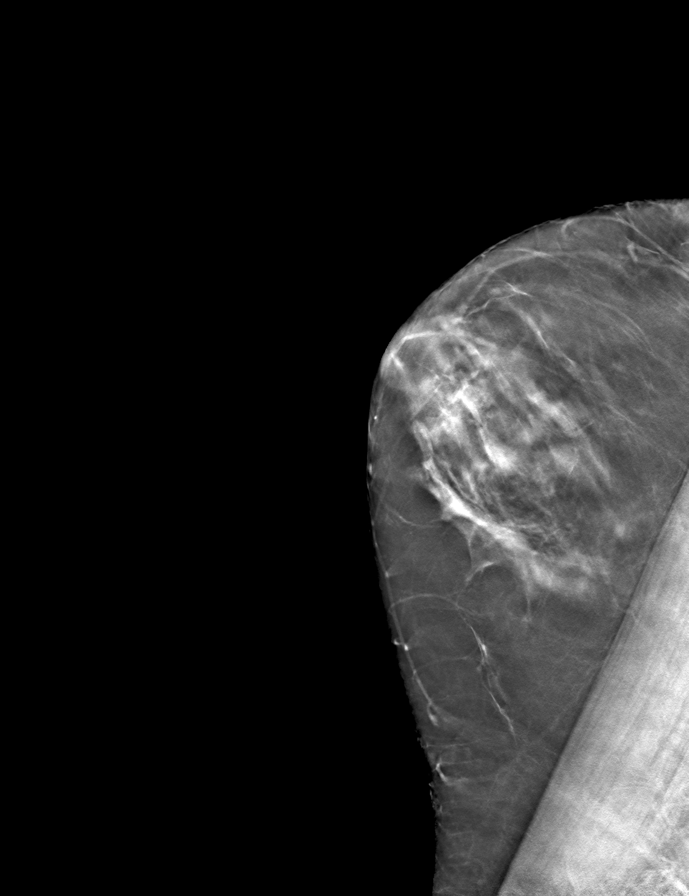

[L CC tomo · tomo slice 36/71.0]
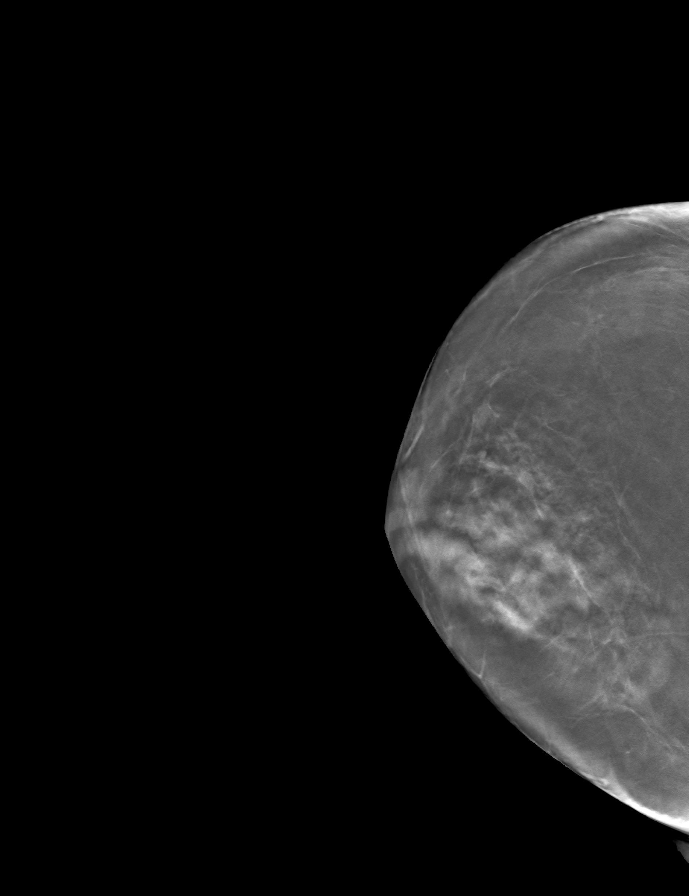

[R MLO tomo · tomo slice 33/66.0]
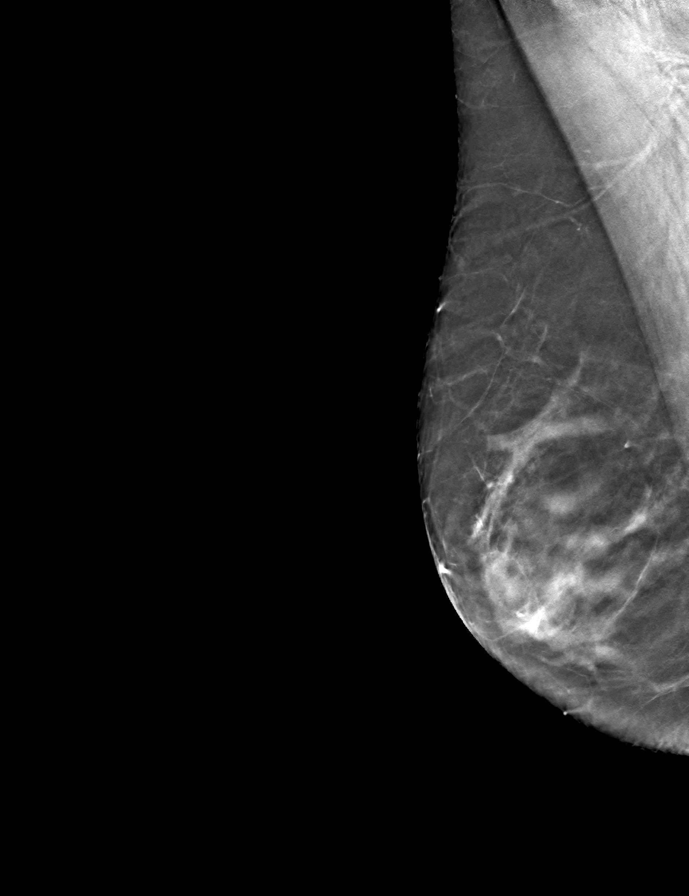

[R CC tomo · tomo slice 34/67.0]
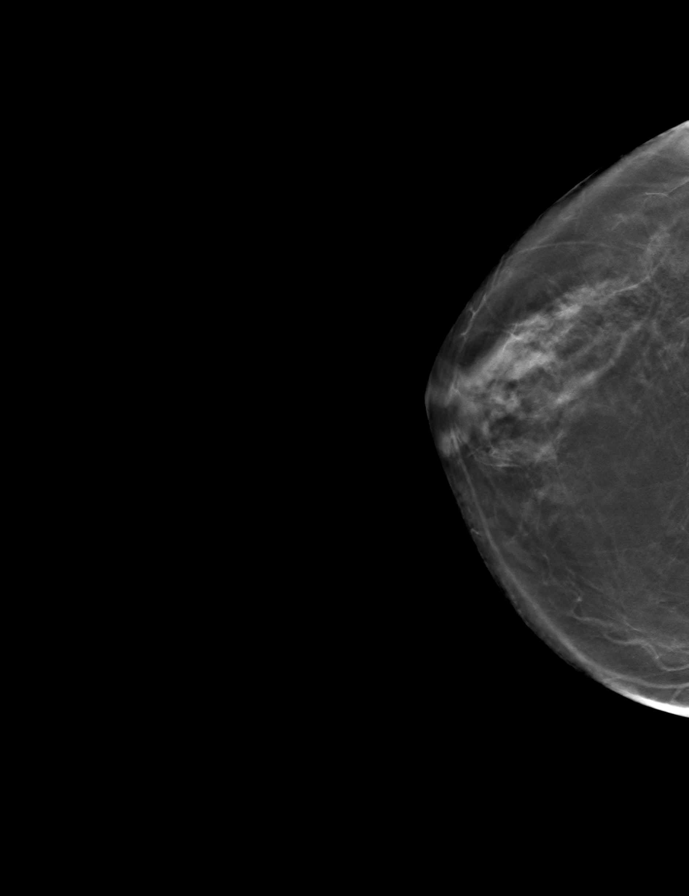

[9 of 24 positions shown; findings below may reference images not displayed]

ACR Breast Density Category c: The breast tissue is heterogeneously
dense, which may obscure small masses.
FINDINGS: There are no findings suspicious for malignancy. The images were
evaluated with computer-aided detection.
IMPRESSION: No mammographic evidence of malignancy. A result letter of this
screening mammogram will be mailed directly to the patient.

RECOMMENDATION:
Screening mammogram in one year. (Code:T4-5-GWO)

BI-RADS CATEGORY  1: Negative.

## 2023-07-12 DIAGNOSIS — Z1231 Encounter for screening mammogram for malignant neoplasm of breast: Secondary | ICD-10-CM | POA: Diagnosis not present

## 2023-07-12 DIAGNOSIS — Z0000 Encounter for general adult medical examination without abnormal findings: Secondary | ICD-10-CM | POA: Diagnosis not present

## 2023-07-12 DIAGNOSIS — I10 Essential (primary) hypertension: Secondary | ICD-10-CM | POA: Diagnosis not present

## 2023-07-12 DIAGNOSIS — E785 Hyperlipidemia, unspecified: Secondary | ICD-10-CM | POA: Diagnosis not present

## 2023-07-12 DIAGNOSIS — Z1211 Encounter for screening for malignant neoplasm of colon: Secondary | ICD-10-CM | POA: Diagnosis not present

## 2023-07-15 ENCOUNTER — Telehealth (HOSPITAL_BASED_OUTPATIENT_CLINIC_OR_DEPARTMENT_OTHER): Payer: Self-pay

## 2023-07-15 ENCOUNTER — Other Ambulatory Visit (HOSPITAL_BASED_OUTPATIENT_CLINIC_OR_DEPARTMENT_OTHER): Payer: Self-pay | Admitting: Family Medicine

## 2023-07-15 DIAGNOSIS — E785 Hyperlipidemia, unspecified: Secondary | ICD-10-CM

## 2023-08-04 ENCOUNTER — Other Ambulatory Visit (HOSPITAL_BASED_OUTPATIENT_CLINIC_OR_DEPARTMENT_OTHER): Payer: Self-pay | Admitting: Family Medicine

## 2023-08-04 DIAGNOSIS — Z139 Encounter for screening, unspecified: Secondary | ICD-10-CM

## 2023-08-12 ENCOUNTER — Encounter (HOSPITAL_BASED_OUTPATIENT_CLINIC_OR_DEPARTMENT_OTHER): Payer: Self-pay

## 2023-08-12 ENCOUNTER — Ambulatory Visit (HOSPITAL_BASED_OUTPATIENT_CLINIC_OR_DEPARTMENT_OTHER)
Admission: RE | Admit: 2023-08-12 | Discharge: 2023-08-12 | Disposition: A | Discharge status: Home / Self Care | Payer: Self-pay | Source: Ambulatory Visit | Attending: Family Medicine | Admitting: Family Medicine

## 2023-08-12 ENCOUNTER — Ambulatory Visit (HOSPITAL_BASED_OUTPATIENT_CLINIC_OR_DEPARTMENT_OTHER)
Admission: RE | Admit: 2023-08-12 | Discharge: 2023-08-12 | Disposition: A | Discharge status: Home / Self Care | Payer: Medicare Other | Source: Ambulatory Visit | Attending: Family Medicine | Admitting: Family Medicine

## 2023-08-12 DIAGNOSIS — Z139 Encounter for screening, unspecified: Secondary | ICD-10-CM

## 2023-08-12 DIAGNOSIS — Z1231 Encounter for screening mammogram for malignant neoplasm of breast: Secondary | ICD-10-CM | POA: Insufficient documentation

## 2023-08-12 DIAGNOSIS — E785 Hyperlipidemia, unspecified: Secondary | ICD-10-CM | POA: Insufficient documentation

## 2023-11-11 DIAGNOSIS — H5213 Myopia, bilateral: Secondary | ICD-10-CM | POA: Diagnosis not present

## 2023-11-11 DIAGNOSIS — H52223 Regular astigmatism, bilateral: Secondary | ICD-10-CM | POA: Diagnosis not present

## 2023-12-03 DIAGNOSIS — R6889 Other general symptoms and signs: Secondary | ICD-10-CM | POA: Diagnosis not present

## 2024-01-04 ENCOUNTER — Ambulatory Visit: Attending: Cardiology | Admitting: Cardiology

## 2024-01-04 ENCOUNTER — Other Ambulatory Visit: Payer: Self-pay

## 2024-01-04 VITALS — BP 142/94 | HR 96 | Ht 67.0 in | Wt 135.0 lb

## 2024-01-04 DIAGNOSIS — E782 Mixed hyperlipidemia: Secondary | ICD-10-CM

## 2024-01-04 DIAGNOSIS — I7121 Aneurysm of the ascending aorta, without rupture: Secondary | ICD-10-CM | POA: Diagnosis not present

## 2024-01-04 DIAGNOSIS — I2510 Atherosclerotic heart disease of native coronary artery without angina pectoris: Secondary | ICD-10-CM | POA: Insufficient documentation

## 2024-01-04 MED ORDER — ROSUVASTATIN CALCIUM 10 MG PO TABS
10.0000 mg | ORAL_TABLET | Freq: Every day | ORAL | 3 refills | Status: AC
Start: 2024-01-04 — End: ?

## 2024-01-04 NOTE — Patient Instructions (Addendum)
 Medication Instructions:  START Crestor  10 mg daily  *If you need a refill on your cardiac medications before your next appointment, please call your pharmacy*  Lab Work: BMP today  Lipid Panel IN SEPTEMBER 2025 If you have labs (blood work) drawn today and your tests are completely normal, you will receive your results only by: MyChart Message (if you have MyChart) OR A paper copy in the mail If you have any lab test that is abnormal or we need to change your treatment, we will call you to review the results.  Testing/Procedures: CT Aorta Non-Cardiac CT Angiography (CTA), is a special type of CT scan that uses a computer to produce multi-dimensional views of major blood vessels throughout the body. In CT angiography, a contrast material is injected through an IV to help visualize the blood vessels   ECHOCARDIOGRAM Your physician has requested that you have an echocardiogram. Echocardiography is a painless test that uses sound waves to create images of your heart. It provides your doctor with information about the size and shape of your heart and how well your heart's chambers and valves are working. This procedure takes approximately one hour. There are no restrictions for this procedure. Please do NOT wear cologne, perfume, aftershave, or lotions (deodorant is allowed). Please arrive 15 minutes prior to your appointment time.  Please note: We ask at that you not bring children with you during ultrasound (echo/ vascular) testing. Due to room size and safety concerns, children are not allowed in the ultrasound rooms during exams. Our front office staff cannot provide observation of children in our lobby area while testing is being conducted. An adult accompanying a patient to their appointment will only be allowed in the ultrasound room at the discretion of the ultrasound technician under special circumstances. We apologize for any inconvenience.   Follow-Up: At [facility], you  and your health needs are our priority.  As part of our continuing mission to provide you with exceptional heart care, our providers are all part of one team.  This team includes your primary Cardiologist (physician) and Advanced Practice Providers or APPs (Physician Assistants and Nurse Practitioners) who all work together to provide you with the care you need, when you need it.  Your next appointment:   August 2026  Provider:   Newman JINNY Lawrence, MD

## 2024-01-04 NOTE — Progress Notes (Signed)
 Cardiology Office Note:  .   Date:  01/04/2024  ID:  [name], DOB Feb 24, 1961, MRN [id number] PCP: Vandy Anes, PA-C  [facility] Providers Cardiologist:  Newman Lawrence, MD PCP: Vandy Anes, PA-C  Chief Complaint  Patient presents with   Ascending aorta aneurysm     Tanya Hernandez is a 63 y.o. female with hypertension, ascending aorta aneurysm, coronary calcification  Discussed the use of AI scribe software for clinical note transcription with the patient, who gave verbal consent to proceed.  History of Present Illness Tanya Hernandez is a 63 year old female with hypertension and coronary artery disease who presents for evaluation of an ascending aorta aneurysm and coronary calcification.  She maintains an active lifestyle, walking a mile daily and participating in Silver Sneakers and Zumba Gold classes without experiencing chest pain or shortness of breath. She experiences spasms under her right rib triggered by certain positions, lasting a few seconds and resolving with stretching. Her blood pressure is slightly elevated despite taking ramipril and a daily baby aspirin. She has episodes of blurred vision and unusual breathing, attributed to electrolyte imbalance from excessive water intake, lasting 10 to 20 minutes, but not occurring recently. Her family history includes cardiovascular issues in her mother and high blood pressure in her father. Cholesterol levels are high, with elevated total cholesterol and LDL, despite good HDL levels. She follows a heart-healthy diet but struggles to avoid processed foods.      Vitals:   01/04/24 1038  BP: (!) 142/94  Pulse: 96  SpO2: 98%      Review of Systems  Cardiovascular:  Negative for chest pain, dyspnea on exertion, leg swelling, palpitations and syncope.        Studies Reviewed: SABRA        Labs 06/2023: Chol 267, TG 142, HDL 111, LDL 142 Hb 12.5  EKG 01/04/2024: Normal sinus rhythm Normal ECG No  previous ECGs available    CT cardiac scoring 08/2023: Left main: 0 Left anterior descending artery: 53.7 Left circumflex artery: 0 Right coronary artery: 0   Total: 53.7 Percentile: 74   Pericardium: Normal.   Ascending Aorta: Mild enlargement 43 mm.   Pulmonary artery: Normal caliber 1. Atherosclerosis. 2. 4.2 cm ascending thoracic aortic aneurysm.   Physical Exam Vitals and nursing note reviewed.  Constitutional:      General: She is not in acute distress. Neck:     Vascular: No JVD.  Cardiovascular:     Rate and Rhythm: Normal rate and regular rhythm.     Heart sounds: Normal heart sounds. No murmur heard. Pulmonary:     Effort: Pulmonary effort is normal.     Breath sounds: Normal breath sounds. No wheezing or rales.  Musculoskeletal:     Right lower leg: No edema.     Left lower leg: No edema.      VISIT DIAGNOSES:   ICD-10-CM   1. Aortic aneurysm without rupture, unspecified portion of aorta (HCC)  I71.9 EKG 12-Lead       [name] is a 63 y.o. female with hypertension, ascending aorta aneurysm, coronary calcification Assessment & Plan Ascending aorta aneurysm: Mildly dilated ascending aorta at 4.2 cm. No acute symptoms. Surgical intervention not indicated until 5.5 cm. Explained risks of larger aortas. - Order CT angiogram for specific dimensions and baseline. - Perform echocardiogram to correlate with CT findings. - Avoid heavy weightlifting and intensive training. - Monitor heart rate and blood pressure; consider additional medication if elevated. - Annual  monitoring with alternating echocardiogram and CT scan.  Hypertension: Blood pressure slightly elevated.  Generally well-controlled on ramipril 2.5 mg daily.  Recommend regular home monitoring.  If remains elevated, could consider increasing ramipril dose, as well as adding atenolol given the finding of ascending aortic aneurysm.    Coronary calcification, mixed hyperlipidemia: Elevated  cholesterol and LDL. Coronary artery calcification present. Discussed need for statin despite good HDL. - Initiate low-dose Crestor  (10 mg). - Repeat lipid panel in September. - Adopt heart-healthy diet and lifestyle. - No symptoms of angina at this time.  Intermittent spasms under right rib, likely musculoskeletal. Unrelated to aortic aneurysm or coronary calcification. - Try stretching exercises for abdominal muscles. - Consider lidocaine patch for symptomatic relief.    Meds ordered this encounter  Medications   rosuvastatin  (CRESTOR ) 10 MG tablet    Sig: Take 1 tablet (10 mg total) by mouth daily.    Dispense:  90 tablet    Refill:  3     F/u in 1 year  Signed, Newman JINNY Lawrence, MD

## 2024-01-05 ENCOUNTER — Ambulatory Visit: Payer: Self-pay

## 2024-01-05 DIAGNOSIS — R6889 Other general symptoms and signs: Secondary | ICD-10-CM | POA: Diagnosis not present

## 2024-01-05 DIAGNOSIS — I7121 Aneurysm of the ascending aorta, without rupture: Secondary | ICD-10-CM

## 2024-01-05 LAB — BASIC METABOLIC PANEL WITH GFR
BUN/Creatinine Ratio: 14 (ref 12–28)
BUN: 9 mg/dL (ref 8–27)
CO2: 19 mmol/L — ABNORMAL LOW (ref 20–29)
Calcium: 9 mg/dL (ref 8.7–10.3)
Chloride: 95 mmol/L — ABNORMAL LOW (ref 96–106)
Creatinine, Ser: 0.65 mg/dL (ref 0.57–1.00)
Glucose: 84 mg/dL (ref 70–99)
Potassium: 3.9 mmol/L (ref 3.5–5.2)
Sodium: 138 mmol/L (ref 134–144)
eGFR: 98 mL/min/1.73 (ref >59)

## 2024-01-12 ENCOUNTER — Ambulatory Visit (HOSPITAL_COMMUNITY)
Admission: RE | Admit: 2024-01-12 | Discharge: 2024-01-12 | Disposition: A | Discharge status: Home / Self Care | Source: Ambulatory Visit | Attending: Cardiology | Admitting: Cardiology

## 2024-01-12 DIAGNOSIS — I712 Thoracic aortic aneurysm, without rupture, unspecified: Secondary | ICD-10-CM | POA: Diagnosis not present

## 2024-01-12 DIAGNOSIS — I2510 Atherosclerotic heart disease of native coronary artery without angina pectoris: Secondary | ICD-10-CM | POA: Diagnosis not present

## 2024-01-12 DIAGNOSIS — I7121 Aneurysm of the ascending aorta, without rupture: Secondary | ICD-10-CM | POA: Insufficient documentation

## 2024-01-12 MED ORDER — IOHEXOL 350 MG/ML SOLN
75.0000 mL | Freq: Once | INTRAVENOUS | Status: AC | PRN
  Administered 2024-01-12: 75 mL via INTRAVENOUS

## 2024-01-15 NOTE — Progress Notes (Signed)
 4.2 cm ascending aorta, stable. Repeat CTA aorta in 1 year.  Thanks MJP

## 2024-01-17 ENCOUNTER — Ambulatory Visit: Admitting: Cardiology

## 2024-01-27 DIAGNOSIS — I10 Essential (primary) hypertension: Secondary | ICD-10-CM | POA: Diagnosis not present

## 2024-01-27 DIAGNOSIS — E785 Hyperlipidemia, unspecified: Secondary | ICD-10-CM | POA: Diagnosis not present

## 2024-02-15 ENCOUNTER — Ambulatory Visit (HOSPITAL_COMMUNITY)
Admission: RE | Admit: 2024-02-15 | Discharge: 2024-02-15 | Disposition: A | Discharge status: Home / Self Care | Source: Ambulatory Visit | Attending: Internal Medicine | Admitting: Internal Medicine

## 2024-02-15 DIAGNOSIS — I7121 Aneurysm of the ascending aorta, without rupture: Secondary | ICD-10-CM | POA: Insufficient documentation

## 2024-02-15 LAB — ECHOCARDIOGRAM COMPLETE
Area-P 1/2: 3.06 cm2
S' Lateral: 3.2 cm

## 2024-02-18 NOTE — Progress Notes (Signed)
 Borderline dilated ascending aorta, otherwise normal echocardiogram. Repeat echocardiogram in 1 year to ensure stability  Thanks MJP

## 2024-03-28 DIAGNOSIS — E785 Hyperlipidemia, unspecified: Secondary | ICD-10-CM | POA: Diagnosis not present

## 2024-03-28 DIAGNOSIS — I10 Essential (primary) hypertension: Secondary | ICD-10-CM | POA: Diagnosis not present

## 2024-07-20 ENCOUNTER — Other Ambulatory Visit (HOSPITAL_BASED_OUTPATIENT_CLINIC_OR_DEPARTMENT_OTHER): Payer: Self-pay | Admitting: Family Medicine

## 2024-07-20 DIAGNOSIS — Z780 Asymptomatic menopausal state: Secondary | ICD-10-CM
